# Patient Record
Sex: Female | Born: 1975 | Hispanic: Yes | Marital: Married | Smoking: Never smoker
Health system: Southern US, Community
[De-identification: ages and names within clinical notes are randomized; demographics above are authoritative.]

## PROBLEM LIST (undated history)

## (undated) DIAGNOSIS — N3020 Other chronic cystitis without hematuria: Secondary | ICD-10-CM

## (undated) DIAGNOSIS — K210 Gastro-esophageal reflux disease with esophagitis: Secondary | ICD-10-CM

## (undated) DIAGNOSIS — K589 Irritable bowel syndrome without diarrhea: Secondary | ICD-10-CM

## (undated) DIAGNOSIS — G43909 Migraine, unspecified, not intractable, without status migrainosus: Secondary | ICD-10-CM

## (undated) DIAGNOSIS — C50919 Malignant neoplasm of unspecified site of unspecified female breast: Secondary | ICD-10-CM

## (undated) DIAGNOSIS — D509 Iron deficiency anemia, unspecified: Secondary | ICD-10-CM

## (undated) HISTORY — PX: ABDOMINAL HYSTERECTOMY: SHX81

## (undated) HISTORY — DX: Gastro-esophageal reflux disease with esophagitis: K21.0

## (undated) HISTORY — PX: MASTECTOMY: SHX3

## (undated) HISTORY — DX: Other chronic cystitis without hematuria: N30.20

## (undated) HISTORY — DX: Iron deficiency anemia, unspecified: D50.9

## (undated) HISTORY — DX: Migraine, unspecified, not intractable, without status migrainosus: G43.909

---

## 2006-09-23 ENCOUNTER — Emergency Department (HOSPITAL_COMMUNITY): Admission: EM | Admit: 2006-09-23 | Discharge: 2006-09-23 | Payer: Self-pay | Admitting: Family Medicine

## 2012-05-08 DIAGNOSIS — G43909 Migraine, unspecified, not intractable, without status migrainosus: Secondary | ICD-10-CM | POA: Insufficient documentation

## 2012-05-08 HISTORY — DX: Migraine, unspecified, not intractable, without status migrainosus: G43.909

## 2013-02-27 DIAGNOSIS — N3020 Other chronic cystitis without hematuria: Secondary | ICD-10-CM

## 2013-02-27 HISTORY — DX: Other chronic cystitis without hematuria: N30.20

## 2015-06-06 DIAGNOSIS — K581 Irritable bowel syndrome with constipation: Secondary | ICD-10-CM | POA: Insufficient documentation

## 2015-07-25 DIAGNOSIS — K2100 Gastro-esophageal reflux disease with esophagitis, without bleeding: Secondary | ICD-10-CM

## 2015-07-25 DIAGNOSIS — D509 Iron deficiency anemia, unspecified: Secondary | ICD-10-CM

## 2015-07-25 HISTORY — DX: Gastro-esophageal reflux disease with esophagitis, without bleeding: K21.00

## 2015-07-25 HISTORY — DX: Iron deficiency anemia, unspecified: D50.9

## 2015-08-24 DIAGNOSIS — C50919 Malignant neoplasm of unspecified site of unspecified female breast: Secondary | ICD-10-CM

## 2015-08-24 HISTORY — DX: Malignant neoplasm of unspecified site of unspecified female breast: C50.919

## 2015-12-31 DIAGNOSIS — D0512 Intraductal carcinoma in situ of left breast: Secondary | ICD-10-CM | POA: Insufficient documentation

## 2016-07-07 DIAGNOSIS — F419 Anxiety disorder, unspecified: Secondary | ICD-10-CM | POA: Insufficient documentation

## 2017-09-26 DIAGNOSIS — Z90710 Acquired absence of both cervix and uterus: Secondary | ICD-10-CM | POA: Insufficient documentation

## 2018-05-02 ENCOUNTER — Emergency Department (INDEPENDENT_AMBULATORY_CARE_PROVIDER_SITE_OTHER): Payer: 59

## 2018-05-02 ENCOUNTER — Other Ambulatory Visit: Payer: Self-pay

## 2018-05-02 ENCOUNTER — Emergency Department
Admission: EM | Admit: 2018-05-02 | Discharge: 2018-05-02 | Disposition: A | Discharge status: Home / Self Care | Payer: 59 | Source: Home / Self Care | Attending: Family Medicine | Admitting: Family Medicine

## 2018-05-02 DIAGNOSIS — M533 Sacrococcygeal disorders, not elsewhere classified: Secondary | ICD-10-CM | POA: Diagnosis not present

## 2018-05-02 DIAGNOSIS — G8929 Other chronic pain: Secondary | ICD-10-CM

## 2018-05-02 DIAGNOSIS — M5137 Other intervertebral disc degeneration, lumbosacral region: Secondary | ICD-10-CM | POA: Diagnosis not present

## 2018-05-02 DIAGNOSIS — M5416 Radiculopathy, lumbar region: Secondary | ICD-10-CM

## 2018-05-02 HISTORY — DX: Irritable bowel syndrome, unspecified: K58.9

## 2018-05-02 HISTORY — DX: Malignant neoplasm of unspecified site of unspecified female breast: C50.919

## 2018-05-02 MED ORDER — PREDNISONE 20 MG PO TABS: ORAL_TABLET | ORAL | 0 refills | Status: DC

## 2018-05-02 NOTE — ED Provider Notes (Signed)
[facility]    CSN: [id number] Arrival date & time: 05/02/18  1204     History   Chief Complaint Chief Complaint  Patient presents with  . Back Pain    Lower    HPI Carmen Mccall is a 42 y.o. female.   Patient developed increasing low back pain since yesterday.  She has a history of chronic lower back pain and SI joint inflammation that started 9 years ago after a fall.  She has had numerous physical therapy sessions this year for her back pain, and reports that her present flare-up is the fourth time this year.   She denies bowel or bladder dysfunction, and no saddle numbness.   The history is provided by the patient.  Back Pain  Location:  Lumbar spine and sacro-iliac joint Quality:  Aching and stabbing Radiates to:  Does not radiate Pain severity:  Moderate Pain is:  Same all the time Onset quality:  Sudden Duration:  1 day Timing:  Constant Progression:  Unchanged Chronicity:  Recurrent Context: not recent injury   Relieved by:  Nothing Worsened by:  Sitting, ambulation, twisting and standing Ineffective treatments:  NSAIDs, heating pad and cold packs Associated symptoms: no abdominal pain, no abdominal swelling, no bladder incontinence, no bowel incontinence, no dysuria, no fever, no leg pain, no numbness, no paresthesias, no pelvic pain, no perianal numbness, no tingling, no weakness and no weight loss     Past Medical History:  Diagnosis Date  . Breast cancer ([place])   . IBS (irritable bowel syndrome)     There are no active problems to display for this patient.   Past Surgical History:  Procedure Laterality Date  . ABDOMINAL HYSTERECTOMY    . MASTECTOMY     with reconstruction    OB History   None      Home Medications    Prior to Admission medications   Medication Sig Start Date End Date Taking? Authorizing Provider  naratriptan (AMERGE) 1 MG TABS tablet Take 1 mg by mouth once as needed. Take one (1) tablet at onset of  headache; if returns or does not resolve, may repeat after 4 hours; do not exceed five (5) mg in 24 hours.   Yes [provider]  nitrofurantoin, macrocrystal-monohydrate, (MACROBID) 100 MG capsule Take 100 mg by mouth as needed.   Yes [provider]  pantoprazole (PROTONIX) 40 MG tablet Take 40 mg by mouth daily.   Yes [provider]  propranolol (INDERAL) 10 MG tablet Take 10 mg by mouth daily.   Yes [provider]  predniSONE (DELTASONE) 20 MG tablet Take one tab by mouth twice daily for 4 days, then one daily. Take with food. 05/02/18   Kandra Nicolas, MD    Family History Family History  Problem Relation Age of Onset  . Cancer Mother   . Hypertension Mother   . Cancer Sister   . Hypertension Sister     Social History Social History   Tobacco Use  . Smoking status: Never Smoker  . Smokeless tobacco: Never Used  Substance Use Topics  . Alcohol use: Yes    Comment: OCC  . Drug use: Never     Allergies   Patient has no known allergies.   Review of Systems Review of Systems  Constitutional: Negative for fever and weight loss.  Gastrointestinal: Negative for abdominal pain and bowel incontinence.  Genitourinary: Negative for bladder incontinence, dysuria and pelvic pain.  Musculoskeletal: Positive for back  pain.  Neurological: Negative for tingling, weakness, numbness and paresthesias.  All other systems reviewed and are negative.    Physical Exam Triage Vital Signs ED Triage Vitals [05/02/18 1236]  Enc Vitals Group     BP (!) 144/98     Pulse Rate 74     Resp      Temp 97.7 F (36.5 C)     Temp Source Oral     SpO2 100 %     Weight 165 lb (74.8 kg)     Height 5\' 4"  (1.626 m)     Head Circumference      Peak Flow      Pain Score 10     Pain Loc      Pain Edu?      Excl. in [place]?    No data found.  Updated Vital Signs BP (!) 144/98 (BP Location: Right Arm)   Pulse 74   Temp 97.7 F (36.5 C) (Oral)   Ht 5\' 4"   (1.626 m)   Wt 74.8 kg   LMP 10/06/2017 (Approximate)   SpO2 100%   BMI 28.32 kg/m   Visual Acuity Right Eye Distance:   Left Eye Distance:   Bilateral Distance:    Right Eye Near:   Left Eye Near:    Bilateral Near:     Physical Exam  Constitutional: She appears well-developed and well-nourished. No distress.  HENT:  Head: Atraumatic.  Eyes: Pupils are equal, round, and reactive to light. Conjunctivae are normal.  Neck: Normal range of motion.  Cardiovascular: Normal heart sounds.  Pulmonary/Chest: Breath sounds normal.  Abdominal: There is no tenderness.  Musculoskeletal: She exhibits no edema.       Back:       Legs: Back:  Range of motion relatively well preserved.  Can heel/toe walk and squat without difficulty.   Tenderness in the midline and right paraspinous muscles from L1 to Sacral area.  There is right paraspinous tenderness from approximately T10 to sacrum.  Straight leg raising test is negative.  Sitting knee extension test is negative.  Strength and sensation in the lower extremities is normal.  Patellar and achilles reflexes absent with augmentation.  There is bilateral SI joint tenderness.  She complains of pain in her anterior thighs but there is no tenderness to palpation in these areas.  Neurological: She is alert.  Skin: Skin is warm and dry. No rash noted.  Nursing note and vitals reviewed.    UC Treatments / Results  Labs (all labs ordered are listed, but only abnormal results are displayed) Labs Reviewed - No data to display  EKG None  Radiology Dg Lumbar Spine Complete  Result Date: 05/02/2018 CLINICAL DATA:  42 year old female with acute lower back pain for 2 days. No injury. Initial encounter. EXAM: LUMBAR SPINE - COMPLETE 4+ VIEW COMPARISON:  None. FINDINGS: Normal alignment lumbar spine without significant disc space narrowing. No compression fracture or pars defect. Minimal L5-S1 facet degenerative changes. Minimal Schmorl's node  deformity L2-3 and inferior endplate L5. No osseous destructive lesion. Sacroiliac joints appear intact. IMPRESSION: 1. Normal alignment without significant lumbar disc space narrowing. 2. Minimal L5-S1 facet degenerative changes. Electronically Signed   By: Genia Del M.D.   On: 05/02/2018 13:46    Procedures Procedures (including critical care time)  Medications Ordered in UC Medications - No data to display  Initial Impression / Assessment and Plan / UC Course  I have reviewed the triage vital signs and the nursing notes.  Pertinent labs & imaging results that were available during my care of the patient were reviewed by me and considered in my medical decision making (see chart for details).    Begin prednisone burst/taper. Followup with Dr. Lynne Leader ([facility]) in one week for management. Final Clinical Impressions(s) / UC Diagnoses   Final diagnoses:  Chronic SI joint pain  Lumbar radiculopathy, acute     Discharge Instructions     Apply ice pack for 20 to 30 minutes, 3 to 4 times daily  Continue until pain decreases    ED Prescriptions    Medication Sig Dispense Auth. Provider   predniSONE (DELTASONE) 20 MG tablet Take one tab by mouth twice daily for 4 days, then one daily. Take with food. 12 tablet Kandra Nicolas, MD        Kandra Nicolas, MD 05/04/18 1329

## 2018-05-02 NOTE — ED Triage Notes (Signed)
Pt c/o lower back pain since yesterday. 4th time this year. Denies recent injury but states she fell 9 years back in same area and has not been the same since. Tries ice, heat and ibuprofen prn with no real relief.

## 2018-05-02 NOTE — Discharge Instructions (Signed)
Apply ice pack for 20 to 30 minutes, 3 to 4 times daily  Continue until pain decreases.  °

## 2018-05-09 ENCOUNTER — Encounter: Payer: Self-pay | Admitting: Family Medicine

## 2018-05-09 ENCOUNTER — Ambulatory Visit (INDEPENDENT_AMBULATORY_CARE_PROVIDER_SITE_OTHER): Payer: 59 | Admitting: Family Medicine

## 2018-05-09 VITALS — BP 112/79 | HR 75 | Ht 64.0 in | Wt 167.0 lb

## 2018-05-09 DIAGNOSIS — M545 Low back pain, unspecified: Secondary | ICD-10-CM

## 2018-05-09 DIAGNOSIS — M7061 Trochanteric bursitis, right hip: Secondary | ICD-10-CM

## 2018-05-09 MED ORDER — LORAZEPAM 0.5 MG PO TABS: ORAL_TABLET | ORAL | 0 refills | Status: AC

## 2018-05-09 NOTE — Progress Notes (Signed)
Subjective:    CC: back pain  HPI: Carmen Mccall has a 9-year history of pain in the right lateral hip and buttocks following a fall.  This is been diagnosed as SI joint dysfunction and treated with physical therapy and chiropractic care.  She has episodes were a gets worse but typically has some kind of chronic baseline pain in her buttocks and her right lateral hip.  She notes recently the pain has been worsening without any specific injury.  Additionally over the last year she is developed pain in her low back.  She notes pain in her bilateral lower back worse with activity somewhat better with rest.  She denies any recent injury.  Again she is had treatment with physical therapy with little benefit.  She does complete some home exercise programs as well.  She denies any pain radiating down her legs weakness or numbness or bowel or bladder dysfunction.  She is tried over-the-counter medications and had not very good results.  He had an exacerbation of pain and was seen in urgent care on September 10.  X-ray of her L-spine at that time showed mild degenerative changes at L5-S1 but otherwise was unremarkable.  She was asked to follow-up with me today for recheck and reevaluation.  Her health is also significant for breast cancer in 2017 status post now bilateral mastectomy and hysterectomy.  She is currently receiving pelvic physical therapy for her hysterectomy pelvis pain.  Past medical history, Surgical history, Family history not pertinant except as noted below, Social history, Allergies, and medications have been entered into the medical record, reviewed, and no changes needed.   Review of Systems: No headache, visual changes, nausea, vomiting, diarrhea, constipation, dizziness, abdominal pain, skin rash, fevers, chills, night sweats, weight loss, swollen lymph nodes, body aches, joint swelling, muscle aches, chest pain, shortness of breath, mood changes, visual or auditory hallucinations.     Objective:    Vitals:   05/09/18 0844  BP: 112/79  Pulse: 75   General: Well Developed, well nourished, and in no acute distress.  Neuro/Psych: Alert and oriented x3, extra-ocular muscles intact, able to move all 4 extremities, sensation grossly intact. Skin: Warm and dry, no rashes noted.  Respiratory: Not using accessory muscles, speaking in full sentences, trachea midline.  Cardiovascular: Pulses palpable, no extremity edema. Abdomen: Does not appear distended. MSK:  L-spine nontender to spinal midline.  Tender to palpation bilateral lumbar paraspinal muscle group.  Normal lumbar motion.  Pain with left rotation and lateral flexion. Patient has very poor voluntary control of her paraspinal muscle group to exam testing. Lower extremity strength is equal normal throughout except for noted below. Normal reflexes bilaterally.  Normal gait.  Right hip: Normal-appearing normal motion.  Tender palpation greater trochanter and along the course of the gluteus medius tendon and muscle group. Hip strength diminished hip abduction 4/5 normal otherwise.   Left hip normal-appearing nontender normal motion normal strength.  Normal SI joint motion bilaterally.  No leg length discrepancy.  Lab and Radiology Results  EXAM: LUMBAR SPINE - COMPLETE 4+ VIEW  COMPARISON:  None.  FINDINGS: Normal alignment lumbar spine without significant disc space narrowing. No compression fracture or pars defect. Minimal L5-S1 facet degenerative changes. Minimal Schmorl's node deformity L2-3 and inferior endplate L5. No osseous destructive lesion. Sacroiliac joints appear intact.  IMPRESSION: 1. Normal alignment without significant lumbar disc space narrowing. 2. Minimal L5-S1 facet degenerative changes.   Electronically Signed   By: Alcide Evener.D.  On: 05/02/2018 13:46 I personally (independently) visualized and performed the interpretation of the images attached in this  note.   Impression and Recommendations:    Assessment and Plan: 42 y.o. female with  Low back pain: Back pain present for about a year now without any injury.  Patient is failing typical conservative management.  She does have some degenerative changes especially seen at L5-S1 and a pertinent history for breast cancer.  I think it is reasonable to proceed with an MRI as she has failed at least a 6-week trial of conservative physical therapy and also has a history of breast cancer.  MRI will also be helpful for potential facet injection planning.  I do not think that [name] has maximized her physical therapy.  Despite good quality physical therapy she still has poor control of her paraspinal muscle flexion.  We reviewed some home exercise program and I was able to find some exercises where she has some activation of her paraspinal muscle including pelvic pressure in a supine position with hip extension as well as pelvic left alternate foot standing.. Plan to recheck after MRI.  Continue home exercise program.  Additionally patient has chronic buttocks and hip pain.  I think this is more likely trochanteric bursitis versus hip abductor tendinopathy.  Again she is failing a typical conservative management.  Plan to work on hip abductor stretching and strengthening.  Recheck after MRI.  Work on side leg raise and alternate foot standing pelvic raise.  Will prescribe lorazepam for MRI claustrophobia.   Orders Placed This Encounter  Procedures  . MR Lumbar Spine Wo Contrast    Standing Status:   Future    Standing Expiration Date:   07/10/2019    Order Specific Question:   What is the patient's sedation requirement?    Answer:   Anti-anxiety    Order Specific Question:   Does the patient have a pacemaker or implanted devices?    Answer:   No    Order Specific Question:   Preferred imaging location?    Answer:   Product[facility]process development scientist (table limit-350lbs)    Order Specific Question:   Radiology  Contrast Protocol - do NOT remove file path    Answer:   [email or web address]   Meds ordered this encounter  Medications  . LORazepam (ATIVAN) 0.5 MG tablet    Sig: 1-2 tabs 30 - 60 min prior to MRI. Do not drive with this medicine.    Dispense:  4 tablet    Refill:  0    Discussed warning signs or symptoms. Please see discharge instructions. Patient expresses understanding.

## 2018-05-09 NOTE — Patient Instructions (Addendum)
Thank you for coming in today. You should hear about MRI soon.   Recheck after MRI.  Work on the home exercises we discussed.  Work on tensing your core while sitting or waling.   Side leg raise Pelvis press leg extension Pelvis lift march slowly.

## 2018-05-15 ENCOUNTER — Ambulatory Visit (INDEPENDENT_AMBULATORY_CARE_PROVIDER_SITE_OTHER): Payer: 59

## 2018-05-15 DIAGNOSIS — M7061 Trochanteric bursitis, right hip: Secondary | ICD-10-CM

## 2018-05-15 DIAGNOSIS — M5416 Radiculopathy, lumbar region: Secondary | ICD-10-CM

## 2018-05-15 DIAGNOSIS — M545 Low back pain, unspecified: Secondary | ICD-10-CM

## 2018-05-18 ENCOUNTER — Ambulatory Visit: Payer: 59 | Admitting: Family Medicine

## 2018-05-19 ENCOUNTER — Ambulatory Visit (INDEPENDENT_AMBULATORY_CARE_PROVIDER_SITE_OTHER): Payer: 59 | Admitting: Family Medicine

## 2018-05-19 ENCOUNTER — Encounter: Payer: Self-pay | Admitting: Family Medicine

## 2018-05-19 VITALS — BP 128/84 | HR 73 | Ht 64.5 in | Wt 164.0 lb

## 2018-05-19 DIAGNOSIS — M545 Low back pain, unspecified: Secondary | ICD-10-CM

## 2018-05-19 MED ORDER — CYCLOBENZAPRINE HCL 10 MG PO TABS: 10.0000 mg | ORAL_TABLET | Freq: Three times a day (TID) | ORAL | 0 refills | Status: AC | PRN

## 2018-05-19 NOTE — Patient Instructions (Addendum)
Thank you for coming in today. You should hear from Pain management clinic about back injections soon.  Let me know if you dont hear anything or cannot get an appointment soonish.   Keep working on home exercises.  Recheck as needed.   Get a CD of the MRI made today and bring with you.   Use flexeril muscle relaxer if needed for spasms mostly at night.   TENS UNIT: This is helpful for muscle pain and spasm.   Search and Purchase a TENS 7000 2nd edition at  www.tenspros.com or www.[employer].com It should be less than $30.     TENS unit instructions: Do not shower or bathe with the unit on Turn the unit off before removing electrodes or batteries If the electrodes lose stickiness add a drop of water to the electrodes after they are disconnected from the unit and place on plastic sheet. If you continued to have difficulty, call the TENS unit company to purchase more electrodes. Do not apply lotion on the skin area prior to use. Make sure the skin is clean and dry as this will help prolong the life of the electrodes. After use, always check skin for unusual red areas, rash or other skin difficulties. If there are any skin problems, does not apply electrodes to the same area. Never remove the electrodes from the unit by pulling the wires. Do not use the TENS unit or electrodes other than as directed. Do not change electrode placement without consultating your therapist or physician. Keep 2 fingers with between each electrode. Wear time ratio is 2:1, on to off times.    For example on for 30 minutes off for 15 minutes and then on for 30 minutes off for 15 minutes      Facet Blocks Patient Information  Description: The facets are joints in the spine between the vertebrae.  Like any joints in the body, facets can become irritated and painful.  Arthritis can also effect the facets.  By injecting steroids and local anesthetic in and around these joints, we can temporarily block the nerve  supply to them.  Steroids act directly on irritated nerves and tissues to reduce selling and inflammation which often leads to decreased pain.  Facet blocks may be done anywhere along the spine from the neck to the low back depending upon the location of your pain.   After numbing the skin with local anesthetic (like Novocaine), a small needle is passed onto the facet joints under x-ray guidance.  You may experience a sensation of pressure while this is being done.  The entire block usually lasts about 15-25 minutes.   Conditions which may be treated by facet blocks:   Low back/buttock pain  Neck/shoulder pain  Certain types of headaches  Preparation for the injection:  1. Do not eat any solid food or dairy products within 8 hours of your appointment. 2. You may drink clear liquid up to 3 hours before appointment.  Clear liquids include water, black coffee, juice or soda.  No milk or cream please. 3. You may take your regular medication, including pain medications, with a sip of water before your appointment.  Diabetics should hold regular insulin (if taken separately) and take 1/2 normal NPH dose the morning of the procedure.  Carry some sugar containing items with you to your appointment. 4. A driver must accompany you and be prepared to drive you home after your procedure. 5. Bring all your current medications with you. 6. An IV may  be inserted and sedation may be given at the discretion of the physician. 7. A blood pressure cuff, EKG and other monitors will often be applied during the procedure.  Some patients may need to have extra oxygen administered for a short period. 8. You will be asked to provide medical information, including your allergies and medications, prior to the procedure.  We must know immediately if you are taking blood thinners (like Coumadin/Warfarin) or if you are allergic to IV iodine contrast (dye).  We must know if you could possible be pregnant.  Possible  side-effects:   Bleeding from needle site  Infection (rare, may require surgery)  Nerve injury (rare)  Numbness & tingling (temporary)  Difficulty urinating (rare, temporary)  Spinal headache (a headache worse with upright posture)  Light-headedness (temporary)  Pain at injection site (serveral days)  Decreased blood pressure (rare, temporary)  Weakness in arm/leg (temporary)  Pressure sensation in back/neck (temporary)   Call if you experience:   Fever/chills associated with headache or increased back/neck pain  Headache worsened by an upright position  New onset, weakness or numbness of an extremity below the injection site  Hives or difficulty breathing (go to the emergency room)  Inflammation or drainage at the injection site(s)  Severe back/neck pain greater than usual  New symptoms which are concerning to you  Please note:  Although the local anesthetic injected can often make your back or neck feel good for several hours after the injection, the pain will likely return. It takes 3-7 days for steroids to work.  You may not notice any pain relief for at least one week.  If effective, we will often do a series of 2-3 injections spaced 3-6 weeks apart to maximally decrease your pain.  After the initial series, you may be a candidate for a more permanent nerve block of the facets.  se

## 2018-05-19 NOTE — Progress Notes (Signed)
Carmen Mccall is a 42 y.o. female who presents to [facility]: [facility] today for back pain.  Carmen Mccall was seen earlier this month for back pain.  This pain is been ongoing now for over a year and occurs intermittently.  She has flareups that are quite bad.  She is had trials of conservative management including physical therapy which has not helped very much.  She notes the pain is typically located in her right lower back worse with activity.  She denies significant radiating pain weakness or numbness fevers or chills.  She has a pertinent past medical history significant for breast cancer currently in remission.  As result of her failure to improve with typical conservative management and of her history of breast cancer she had a lumbar MRI which fortunately did not show much.  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   ROS as above:  Exam:  BP 128/84   Pulse 73   Ht 5' 4.5" (1.638 m)   Wt 164 lb (74.4 kg)   BMI 27.72 kg/m  Wt Readings from Last 5 Encounters:  05/19/18 164 lb (74.4 kg)  05/09/18 167 lb (75.8 kg)  05/02/18 165 lb (74.8 kg)    Gen: Well NAD  L-spine: Normal motion pain with extension.  Normal gait.  Lab and Radiology Results No results found for this or any previous visit (from the past 72 hour(s)). Mr Lumbar Spine Wo Contrast  Result Date: 05/15/2018 CLINICAL DATA:  Low back pain that radiates down the right leg for 9 years EXAM: MRI LUMBAR SPINE WITHOUT CONTRAST TECHNIQUE: Multiplanar, multisequence MR imaging of the lumbar spine was performed. No intravenous contrast was administered. COMPARISON:  None. FINDINGS: Segmentation:  Standard. Alignment:  Physiologic. Vertebrae:  No fracture, evidence of discitis, or bone lesion. Conus medullaris and cauda equina: Conus extends to the T12 level. Conus  and cauda equina appear normal. Paraspinal and other soft tissues: No acute paraspinal abnormality. Disc levels: Disc spaces: Disc spaces are maintained. T12-L1: No significant disc bulge. No evidence of neural foraminal stenosis. No central canal stenosis. L1-L2: No significant disc bulge. No evidence of neural foraminal stenosis. No central canal stenosis. L2-L3: No significant disc bulge. No evidence of neural foraminal stenosis. No central canal stenosis. L3-L4: No significant disc bulge. No evidence of neural foraminal stenosis. No central canal stenosis. L4-L5: No significant disc bulge. No evidence of neural foraminal stenosis. No central canal stenosis. L5-S1: No significant disc bulge. Mild bilateral facet arthropathy. No evidence of neural foraminal stenosis. No central canal stenosis. IMPRESSION: 1. Post no significant disc protrusion, foraminal stenosis or central canal stenosis. 2.  No acute osseous injury of the lumbar spine. Electronically Signed   By: Kathreen Devoid   On: 05/15/2018 15:59   I personally (independently) visualized and performed the interpretation of the images attached in this note.    Assessment and Plan: 42 y.o. female with continued pain and low back after failing conservative management.  Patient's pain is intermittent in nature but she does have some continuous component.  I do believe that a lot of the cause of her pain is probably myofascial does function and spasm however if she does have degenerative changes at her L5-S1 facets.  I think it is reasonable to proceed with a trial of facet injections.  She works in [employer] and I think is reasonable to proceed with facet  injections in the [place] area.  Plan to refer to [facility] pain management for facet injections and ultimately medial branch block and facet ablation if benefiting.  Patient will bring a copy of the CD of the images with her to the visit.  Will prescribe Flexeril for use as needed along with  heating pad TENS unit at home exercise program for flareups.  Check with me as needed.  I spent 15 minutes with this patient, greater than 50% was face-to-face time counseling regarding ddx and plan.  Orders Placed This Encounter  Procedures  . Ambulatory referral to Pain Clinic    Referral Priority:   Routine    Referral Type:   Consultation    Referral Reason:   Specialty Services Required    Referred to Provider:   Illene Silver, MD    Requested Specialty:   Pain Medicine    Number of Visits Requested:   1   Meds ordered this encounter  Medications  . cyclobenzaprine (FLEXERIL) 10 MG tablet    Sig: Take 1 tablet (10 mg total) by mouth 3 (three) times daily as needed for muscle spasms.    Dispense:  30 tablet    Refill:  0     Historical information moved to improve visibility of documentation.  Past Medical History:  Diagnosis Date  . Breast cancer ([place]) 2017   Breast  . Chronic cystitis 02/27/2013  . Gastroesophageal reflux disease with esophagitis 07/25/2015  . IBS (irritable bowel syndrome)   . Iron deficiency anemia 07/25/2015  . Migraine 05/08/2012   Past Surgical History:  Procedure Laterality Date  . ABDOMINAL HYSTERECTOMY    . MASTECTOMY     with reconstruction   Social History   Tobacco Use  . Smoking status: Never Smoker  . Smokeless tobacco: Never Used  Substance Use Topics  . Alcohol use: Yes    Comment: OCC   family history includes Cancer in her mother and sister; Hypertension in her mother and sister.  Medications: Current Outpatient Medications  Medication Sig Dispense Refill  . LORazepam (ATIVAN) 0.5 MG tablet 1-2 tabs 30 - 60 min prior to MRI. Do not drive with this medicine. 4 tablet 0  . naratriptan (AMERGE) 1 MG TABS tablet Take 1 mg by mouth once as needed. Take one (1) tablet at onset of headache; if returns or does not resolve, may repeat after 4 hours; do not exceed five (5) mg in 24 hours.    . nitrofurantoin,  macrocrystal-monohydrate, (MACROBID) 100 MG capsule Take 100 mg by mouth as needed.    . pantoprazole (PROTONIX) 40 MG tablet Take 40 mg by mouth daily.    . propranolol (INDERAL) 10 MG tablet Take 10 mg by mouth daily.    . cyclobenzaprine (FLEXERIL) 10 MG tablet Take 1 tablet (10 mg total) by mouth 3 (three) times daily as needed for muscle spasms. 30 tablet 0   No current facility-administered medications for this visit.    No Known Allergies   Discussed warning signs or symptoms. Please see discharge instructions. Patient expresses understanding.\

## 2018-08-01 ENCOUNTER — Encounter: Payer: Self-pay | Admitting: *Deleted

## 2018-08-01 ENCOUNTER — Emergency Department
Admission: EM | Admit: 2018-08-01 | Discharge: 2018-08-01 | Disposition: A | Discharge status: Home / Self Care | Payer: 59 | Source: Home / Self Care

## 2018-08-01 DIAGNOSIS — R6889 Other general symptoms and signs: Secondary | ICD-10-CM

## 2018-08-01 MED ORDER — OSELTAMIVIR PHOSPHATE 75 MG PO CAPS: 75.0000 mg | ORAL_CAPSULE | Freq: Two times a day (BID) | ORAL | 0 refills | Status: AC

## 2018-08-01 MED ORDER — IPRATROPIUM BROMIDE 0.06 % NA SOLN: 2.0000 | Freq: Four times a day (QID) | NASAL | 1 refills | Status: AC

## 2018-08-01 NOTE — ED Triage Notes (Signed)
Yesterday onset of sore throat, facial pain, runny nose, body aches. Recent close contact with family member dx with flu.

## 2018-08-01 NOTE — ED Provider Notes (Signed)
[facility]    CSN: [id number] Arrival date & time: 08/01/18  1157     History   Chief Complaint Chief Complaint  Patient presents with  . Nasal Congestion  . Facial Pain  . Sore Throat  . Otalgia    HPI Carmen Mccall is a 42 y.o. female.   HPI Carmen Mccall is a 42 y.o. female presenting to UC with c/o sudden onset sore throat, facial pain, sinus congestion, body aches, and subjective fever. She was In close contact recently with a family member who recently tested positive for the flu.  Pt did get the flu vaccine this year.  Denies vomiting or diarrhea but has mild nausea.  No medication taken PTA.    Past Medical History:  Diagnosis Date  . Breast cancer ([place]) 2017   Breast  . Chronic cystitis 02/27/2013  . Gastroesophageal reflux disease with esophagitis 07/25/2015  . IBS (irritable bowel syndrome)   . Iron deficiency anemia 07/25/2015  . Migraine 05/08/2012    Patient Active Problem List   Diagnosis Date Noted  . S/P TAH (total abdominal hysterectomy) 09/26/2017  . Anxiety 07/07/2016  . 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 Gastroesophageal reflux disease with esophagitis 07/25/2015  . Iron deficiency anemia 07/25/2015  . Irritable bowel syndrome with constipation 06/06/2015  . Chronic cystitis 02/27/2013  . Migraine 05/08/2012    Past Surgical History:  Procedure Laterality Date  . ABDOMINAL HYSTERECTOMY    . MASTECTOMY     with reconstruction    OB History   None      Home Medications    Prior to Admission medications   Medication Sig Start Date End Date Taking? Authorizing Provider  pantoprazole (PROTONIX) 40 MG tablet Take 40 mg by mouth daily.   Yes [provider]  cyclobenzaprine (FLEXERIL) 10 MG tablet Take 1 tablet (10 mg total) by mouth 3 (three) times daily as needed for muscle spasms. 05/19/18   Gregor Hams, MD  ipratropium (ATROVENT) 0.06 % nasal spray Place 2 sprays into both nostrils  4 (four) times daily. 08/01/18   Noe Gens, PA-C  LORazepam (ATIVAN) 0.5 MG tablet 1-2 tabs 30 - 60 min prior to MRI. Do not drive with this medicine. 05/09/18   Gregor Hams, MD  naratriptan (AMERGE) 1 MG TABS tablet Take 1 mg by mouth once as needed. Take one (1) tablet at onset of headache; if returns or does not resolve, may repeat after 4 hours; do not exceed five (5) mg in 24 hours.    [provider]  nitrofurantoin, macrocrystal-monohydrate, (MACROBID) 100 MG capsule Take 100 mg by mouth as needed.    [provider]  oseltamivir (TAMIFLU) 75 MG capsule Take 1 capsule (75 mg total) by mouth every 12 (twelve) hours. 08/01/18   Noe Gens, PA-C  propranolol (INDERAL) 10 MG tablet Take 10 mg by mouth daily.    [provider]    Family History Family History  Problem Relation Age of Onset  . Cancer Mother   . Hypertension Mother   . Cancer Sister   . Hypertension Sister   . Other Father     Social History Social History   Tobacco Use  . Smoking status: Never Smoker  . Smokeless tobacco: Never Used  Substance Use Topics  . Alcohol use: Yes    Comment: OCC  . Drug use: Never     Allergies   Patient has  no known allergies.   Review of Systems Review of Systems  Constitutional: Positive for fatigue and fever (subjective). Negative for chills.  HENT: Positive for congestion, postnasal drip, sinus pressure, sinus pain and sore throat. Negative for ear pain, trouble swallowing and voice change.   Respiratory: Positive for cough. Negative for shortness of breath.   Cardiovascular: Negative for chest pain and palpitations.  Gastrointestinal: Negative for abdominal pain, diarrhea, nausea and vomiting.  Musculoskeletal: Negative for arthralgias, back pain and myalgias.  Skin: Negative for rash.  All other systems reviewed and are negative.    Physical Exam Triage Vital Signs ED Triage Vitals [08/01/18 1233]  Enc Vitals Group     BP  136/82     Pulse Rate 84     Resp 16     Temp 98.9 F (37.2 C)     Temp Source Oral     SpO2 98 %     Weight 165 lb (74.8 kg)     Height 5\' 4"  (1.626 m)     Head Circumference      Peak Flow      Pain Score 4     Pain Loc      Pain Edu?      Excl. in [place]?    No data found.  Updated Vital Signs BP 136/82 (BP Location: Right Arm)   Pulse 84   Temp 98.9 F (37.2 C) (Oral)   Resp 16   Ht 5\' 4"  (1.626 m)   Wt 165 lb (74.8 kg)   SpO2 98%   BMI 28.32 kg/m   Visual Acuity Right Eye Distance:   Left Eye Distance:   Bilateral Distance:    Right Eye Near:   Left Eye Near:    Bilateral Near:     Physical Exam  Constitutional: She is oriented to person, place, and time. She appears well-developed and well-nourished.  Non-toxic appearance. She does not appear ill.  HENT:  Head: Normocephalic and atraumatic.  Right Ear: Tympanic membrane normal.  Left Ear: Tympanic membrane normal.  Nose: Nose normal.  Mouth/Throat: Uvula is midline, oropharynx is clear and moist and mucous membranes are normal.  Eyes: EOM are normal.  Neck: Normal range of motion. Neck supple.  Cardiovascular: Normal rate and regular rhythm.  Pulmonary/Chest: Effort normal and breath sounds normal. No stridor. No respiratory distress. She has no wheezes. She has no rhonchi.  Musculoskeletal: Normal range of motion.  Lymphadenopathy:    She has no cervical adenopathy.  Neurological: She is alert and oriented to person, place, and time.  Skin: Skin is warm and dry.  Psychiatric: She has a normal mood and affect. Her behavior is normal.  Nursing note and vitals reviewed.    UC Treatments / Results  Labs (all labs ordered are listed, but only abnormal results are displayed) Labs Reviewed  POCT INFLUENZA A/B  POCT RAPID STREP A (OFFICE)    EKG None  Radiology No results found.  Procedures Procedures (including critical care time)  Medications Ordered in UC Medications - No data to  display  Initial Impression / Assessment and Plan / UC Course  I have reviewed the triage vital signs and the nursing notes.  Pertinent labs & imaging results that were available during my care of the patient were reviewed by me and considered in my medical decision making (see chart for details).     Rapid strep and flu: NEGATIVE Discussed possibility of false negative given recent exposure to flu Pt would  like to try the Tamiflu Home care info provided.  Final Clinical Impressions(s) / UC Diagnoses   Final diagnoses:  Flu-like symptoms     Discharge Instructions      You may take 500mg  acetaminophen every 4-6 hours or in combination with ibuprofen 400-600mg  every 6-8 hours as needed for pain, inflammation, and fever.  Be sure to well hydrated with clear liquids and get at least 8 hours of sleep at night, preferably more while sick.   Please follow up with family medicine in 1 week if needed.      ED Prescriptions    Medication Sig Dispense Auth. Provider   oseltamivir (TAMIFLU) 75 MG capsule Take 1 capsule (75 mg total) by mouth every 12 (twelve) hours. 10 capsule Leeroy Cha O, PA-C   ipratropium (ATROVENT) 0.06 % nasal spray Place 2 sprays into both nostrils 4 (four) times daily. 15 mL Noe Gens, PA-C     Controlled Substance Prescriptions [place] Controlled Substance Registry consulted? Not Applicable   Tyrell Antonio 08/01/18 1551

## 2018-08-01 NOTE — Discharge Instructions (Signed)
  You may take 500mg acetaminophen every 4-6 hours or in combination with ibuprofen 400-600mg every 6-8 hours as needed for pain, inflammation, and fever.  Be sure to well hydrated with clear liquids and get at least 8 hours of sleep at night, preferably more while sick.   Please follow up with family medicine in 1 week if needed.   

## 2018-08-05 ENCOUNTER — Emergency Department
Admission: EM | Admit: 2018-08-05 | Discharge: 2018-08-05 | Disposition: A | Discharge status: Home / Self Care | Payer: 59 | Source: Home / Self Care

## 2018-08-05 ENCOUNTER — Emergency Department (INDEPENDENT_AMBULATORY_CARE_PROVIDER_SITE_OTHER): Payer: 59

## 2018-08-05 ENCOUNTER — Other Ambulatory Visit: Payer: Self-pay

## 2018-08-05 ENCOUNTER — Encounter: Payer: Self-pay | Admitting: Emergency Medicine

## 2018-08-05 DIAGNOSIS — R0989 Other specified symptoms and signs involving the circulatory and respiratory systems: Secondary | ICD-10-CM

## 2018-08-05 DIAGNOSIS — R05 Cough: Secondary | ICD-10-CM

## 2018-08-05 DIAGNOSIS — J069 Acute upper respiratory infection, unspecified: Secondary | ICD-10-CM

## 2018-08-05 DIAGNOSIS — B9789 Other viral agents as the cause of diseases classified elsewhere: Secondary | ICD-10-CM | POA: Diagnosis not present

## 2018-08-05 MED ORDER — BENZONATATE 100 MG PO CAPS: 100.0000 mg | ORAL_CAPSULE | Freq: Three times a day (TID) | ORAL | 0 refills | Status: AC

## 2018-08-05 MED ORDER — DEXTROMETHORPHAN HBR 15 MG/5ML PO SYRP: 5.0000 mL | ORAL_SOLUTION | Freq: Three times a day (TID) | ORAL | 0 refills | Status: AC | PRN

## 2018-08-05 NOTE — ED Triage Notes (Signed)
Patient returns having been seen on 08/01/18 for potential influenza. She did not take Tamiflu because she has not had a fever and does not feel this is the problem. She does have a cough which is reason for visit. No OTCs since 0400.

## 2018-08-05 NOTE — Discharge Instructions (Signed)
  You may take 500mg acetaminophen every 4-6 hours or in combination with ibuprofen 400-600mg every 6-8 hours as needed for pain, inflammation, and fever.  Be sure to well hydrated with clear liquids and get at least 8 hours of sleep at night, preferably more while sick.   Please follow up with family medicine in 1 week if needed.   

## 2018-08-05 NOTE — ED Provider Notes (Signed)
[facility]    CSN: [id number] Arrival date & time: 08/05/18  1103     History   Chief Complaint Chief Complaint  Patient presents with  . Cough    HPI Carmen Mccall is a 42 y.o. female.   HPI Carmen Mccall is a 42 y.o. female presenting to UC with c/o worsening cough that started about 1 week ago. Cough is occasionally productive and deep, worse at night, which keeps her up. She was seen at [facility] on 08/01/18 for flu-like symptoms after exposure to the flu. She tested negative for flu but was prescribed Tamiflu to take as precaution. Pt never took the medication because she never developed a fever. She is concerned the cough continues to worsen and would like cough medication for night to help sleep. OTC medications have not helped.     Past Medical History:  Diagnosis Date  . Breast cancer ([place]) 2017   Breast  . Chronic cystitis 02/27/2013  . Gastroesophageal reflux disease with esophagitis 07/25/2015  . IBS (irritable bowel syndrome)   . Iron deficiency anemia 07/25/2015  . Migraine 05/08/2012    Patient Active Problem List   Diagnosis Date Noted  . S/P TAH (total abdominal hysterectomy) 09/26/2017  . Anxiety 07/07/2016  . 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 Gastroesophageal reflux disease with esophagitis 07/25/2015  . Iron deficiency anemia 07/25/2015  . Irritable bowel syndrome with constipation 06/06/2015  . Chronic cystitis 02/27/2013  . Migraine 05/08/2012    Past Surgical History:  Procedure Laterality Date  . ABDOMINAL HYSTERECTOMY    . MASTECTOMY     with reconstruction    OB History   No obstetric history on file.      Home Medications    Prior to Admission medications   Medication Sig Start Date End Date Taking? Authorizing Provider  benzonatate (TESSALON) 100 MG capsule Take 1-2 capsules (100-200 mg total) by mouth every 8 (eight) hours. 08/05/18   Noe Gens, PA-C  cyclobenzaprine (FLEXERIL) 10 MG  tablet Take 1 tablet (10 mg total) by mouth 3 (three) times daily as needed for muscle spasms. 05/19/18   Gregor Hams, MD  dextromethorphan (TUSSIN COUGH) 15 MG/5ML syrup Take 5-10 mLs (15-30 mg total) by mouth 3 (three) times daily as needed for cough. 08/05/18   Noe Gens, PA-C  ipratropium (ATROVENT) 0.06 % nasal spray Place 2 sprays into both nostrils 4 (four) times daily. 08/01/18   Noe Gens, PA-C  LORazepam (ATIVAN) 0.5 MG tablet 1-2 tabs 30 - 60 min prior to MRI. Do not drive with this medicine. 05/09/18   Gregor Hams, MD  naratriptan (AMERGE) 1 MG TABS tablet Take 1 mg by mouth once as needed. Take one (1) tablet at onset of headache; if returns or does not resolve, may repeat after 4 hours; do not exceed five (5) mg in 24 hours.    [provider]  nitrofurantoin, macrocrystal-monohydrate, (MACROBID) 100 MG capsule Take 100 mg by mouth as needed.    [provider]  oseltamivir (TAMIFLU) 75 MG capsule Take 1 capsule (75 mg total) by mouth every 12 (twelve) hours. 08/01/18   Noe Gens, PA-C  pantoprazole (PROTONIX) 40 MG tablet Take 40 mg by mouth daily.    [provider]  propranolol (INDERAL) 10 MG tablet Take 10 mg by mouth daily.    [provider]    Family History Family History  Problem Relation Age  of Onset  . Cancer Mother   . Hypertension Mother   . Cancer Sister   . Hypertension Sister   . Other Father     Social History Social History   Tobacco Use  . Smoking status: Never Smoker  . Smokeless tobacco: Never Used  Substance Use Topics  . Alcohol use: Yes    Comment: OCC  . Drug use: Never     Allergies   Patient has no known allergies.   Review of Systems Review of Systems  Constitutional: Negative for chills and fever.  HENT: Positive for congestion and postnasal drip. Negative for ear pain, sore throat, trouble swallowing and voice change.   Respiratory: Positive for cough. Negative for shortness of  breath.   Cardiovascular: Negative for chest pain and palpitations.  Gastrointestinal: Negative for abdominal pain, diarrhea, nausea and vomiting.  Musculoskeletal: Negative for arthralgias, back pain and myalgias.  Skin: Negative for rash.     Physical Exam Triage Vital Signs ED Triage Vitals  Enc Vitals Group     BP 08/05/18 1140 127/90     Pulse Rate 08/05/18 1140 94     Resp 08/05/18 1140 18     Temp 08/05/18 1140 98.1 F (36.7 C)     Temp Source 08/05/18 1140 Oral     SpO2 08/05/18 1140 98 %     Weight 08/05/18 1141 165 lb (74.8 kg)     Height 08/05/18 1141 5\' 4"  (1.626 m)     Head Circumference --      Peak Flow --      Pain Score 08/05/18 1141 0     Pain Loc --      Pain Edu? --      Excl. in [place]? --    No data found.  Updated Vital Signs BP 127/90 (BP Location: Right Arm)   Pulse 94   Temp 98.1 F (36.7 C) (Oral)   Resp 18   Ht 5\' 4"  (1.626 m)   Wt 165 lb (74.8 kg)   LMP 10/06/2017 (Approximate)   SpO2 98%   BMI 28.32 kg/m   Visual Acuity Right Eye Distance:   Left Eye Distance:   Bilateral Distance:    Right Eye Near:   Left Eye Near:    Bilateral Near:     Physical Exam Vitals signs and nursing note reviewed.  Constitutional:      Appearance: Normal appearance. She is well-developed.  HENT:     Head: Normocephalic and atraumatic.     Right Ear: Tympanic membrane normal.     Left Ear: Tympanic membrane normal.     Nose: Nose normal.     Mouth/Throat:     Lips: Pink.     Mouth: Mucous membranes are dry.     Pharynx: Oropharynx is clear.  Neck:     Musculoskeletal: Normal range of motion and neck supple.  Cardiovascular:     Rate and Rhythm: Normal rate and regular rhythm.  Pulmonary:     Effort: Pulmonary effort is normal. No respiratory distress.     Breath sounds: Normal breath sounds. No stridor. No wheezing or rhonchi.  Musculoskeletal: Normal range of motion.  Skin:    General: Skin is warm and dry.  Neurological:     Mental  Status: She is alert and oriented to person, place, and time.  Psychiatric:        Behavior: Behavior normal.      UC Treatments / Results  Labs (all labs ordered  are listed, but only abnormal results are displayed) Labs Reviewed - No data to display  EKG None  Radiology Dg Chest 2 View  Result Date: 08/05/2018 CLINICAL DATA:  42 year old female with productive cough and chest heaviness for the past week. EXAM: CHEST - 2 VIEW COMPARISON:  None. FINDINGS: The lungs are clear and negative for focal airspace consolidation, pulmonary edema or suspicious pulmonary nodule. No pleural effusion or pneumothorax. Cardiac and mediastinal contours are within normal limits. No acute fracture or lytic or blastic osseous lesions. The visualized upper abdominal bowel gas pattern is unremarkable. IMPRESSION: Negative chest x-ray. Electronically Signed   By: Jacqulynn Cadet M.D.   On: 08/05/2018 12:05    Procedures Procedures (including critical care time)  Medications Ordered in UC Medications - No data to display  Initial Impression / Assessment and Plan / UC Course  I have reviewed the triage vital signs and the nursing notes.  Pertinent labs & imaging results that were available during my care of the patient were reviewed by me and considered in my medical decision making (see chart for details).    Reviewed imaging with pt  Reassured pt no bronchitis or pneumonia Cough medication prescribed Encouraged f/u with PCP in 1 week if needed.  Final Clinical Impressions(s) / UC Diagnoses   Final diagnoses:  Viral URI with cough     Discharge Instructions      You may take 500mg  acetaminophen every 4-6 hours or in combination with ibuprofen 400-600mg  every 6-8 hours as needed for pain, inflammation, and fever.  Be sure to well hydrated with clear liquids and get at least 8 hours of sleep at night, preferably more while sick.   Please follow up with family medicine in 1 week if  needed.      ED Prescriptions    Medication Sig Dispense Auth. Provider   benzonatate (TESSALON) 100 MG capsule Take 1-2 capsules (100-200 mg total) by mouth every 8 (eight) hours. 21 capsule Gerarda Fraction, Raffael Bugarin O, PA-C   dextromethorphan (TUSSIN COUGH) 15 MG/5ML syrup Take 5-10 mLs (15-30 mg total) by mouth 3 (three) times daily as needed for cough. 80 mL Noe Gens, PA-C     Controlled Substance Prescriptions [place] Controlled Substance Registry consulted? Not Applicable   Tyrell Antonio 08/05/18 1322

## 2018-08-24 LAB — POCT RAPID STREP A (OFFICE): Rapid Strep A Screen: NEGATIVE

## 2018-08-24 LAB — POCT INFLUENZA A/B
Influenza A, POC: NEGATIVE
Influenza B, POC: NEGATIVE

## 2020-08-04 IMAGING — DX DG CHEST 2V
2 series · 2 of 2 positions shown · non-contrast
Comparison: None.

CLINICAL DATA: 42-year-old female with productive cough and chest
heaviness for the past week.

EXAM:
CHEST - 2 VIEW

[chest pa]
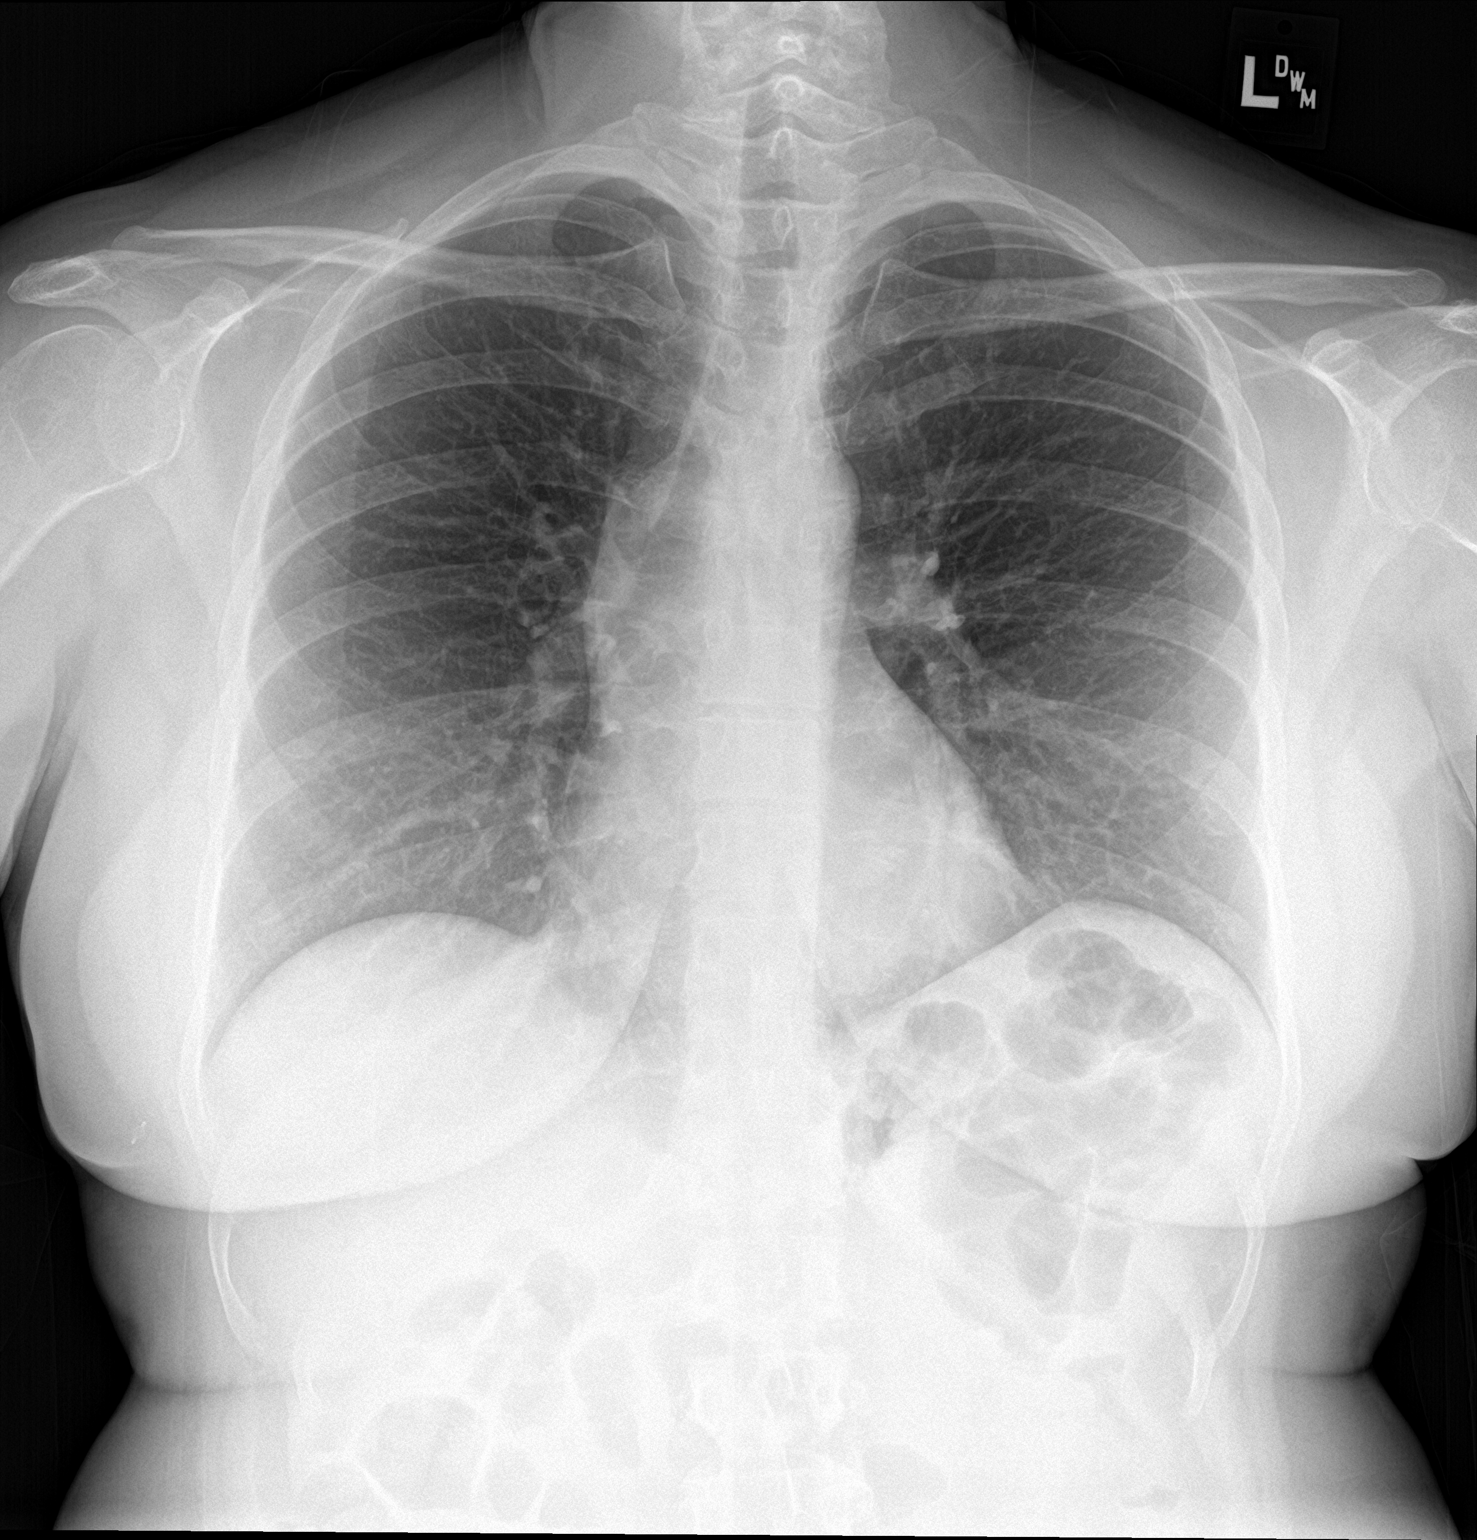

[chest lat]
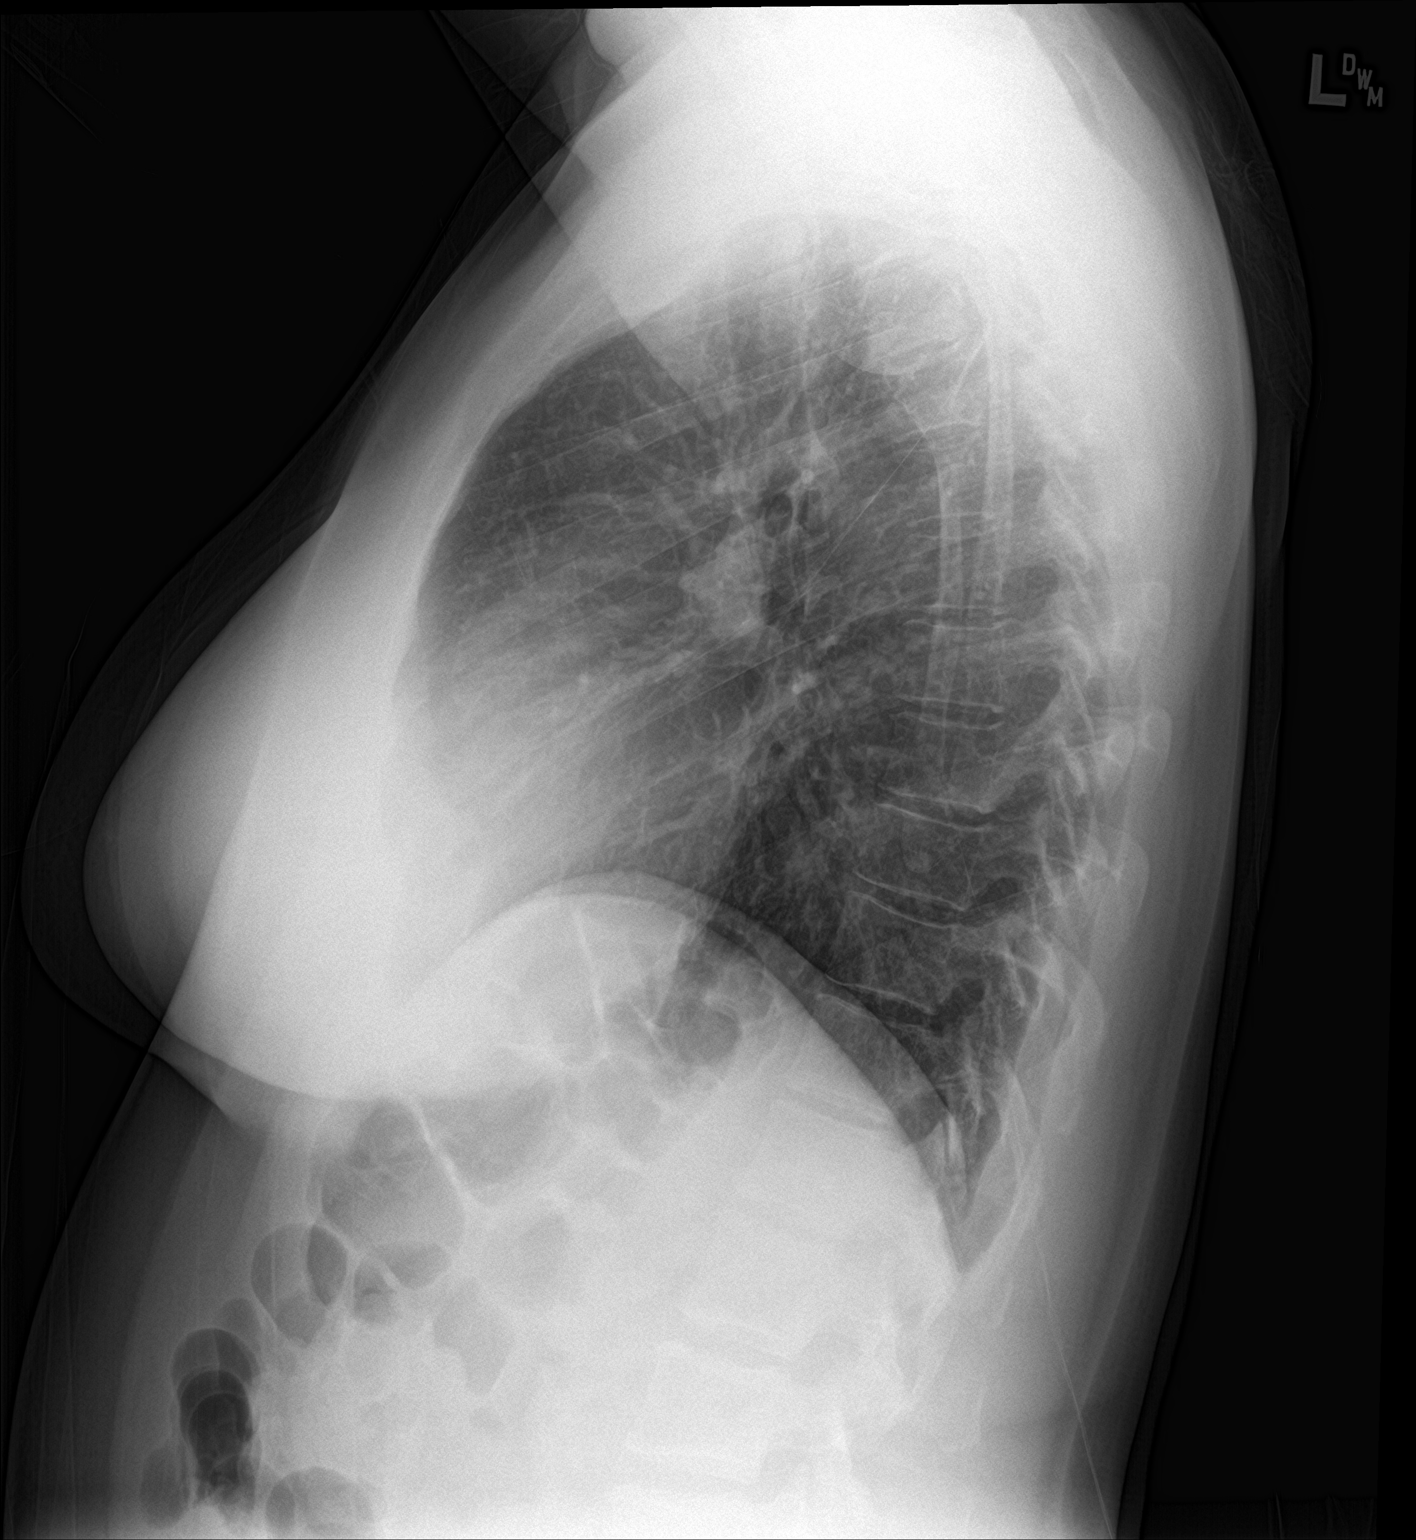

[2 of 2 positions shown; findings below may reference images not displayed]

FINDINGS: The lungs are clear and negative for focal airspace consolidation,
pulmonary edema or suspicious pulmonary nodule. No pleural effusion
or pneumothorax. Cardiac and mediastinal contours are within normal
limits. No acute fracture or lytic or blastic osseous lesions. The
visualized upper abdominal bowel gas pattern is unremarkable.
IMPRESSION: Negative chest x-ray.
